# Patient Record
Sex: Female | Born: 1955 | Marital: Married | Smoking: Never smoker
Health system: Southern US, Community
[De-identification: ages and names within clinical notes are randomized; demographics above are authoritative.]

## PROBLEM LIST (undated history)

## (undated) DIAGNOSIS — M8580 Other specified disorders of bone density and structure, unspecified site: Secondary | ICD-10-CM

## (undated) HISTORY — PX: KNEE ARTHROSCOPY: SHX127

## (undated) HISTORY — DX: Other specified disorders of bone density and structure, unspecified site: M85.80

---

## 2005-10-30 ENCOUNTER — Ambulatory Visit: Payer: Self-pay | Admitting: Family Medicine

## 2007-07-09 ENCOUNTER — Ambulatory Visit: Payer: Self-pay | Admitting: Unknown Physician Specialty

## 2008-04-12 ENCOUNTER — Ambulatory Visit: Payer: Self-pay | Admitting: Internal Medicine

## 2013-07-20 ENCOUNTER — Ambulatory Visit: Payer: Self-pay

## 2013-08-23 DIAGNOSIS — S8991XA Unspecified injury of right lower leg, initial encounter: Secondary | ICD-10-CM | POA: Insufficient documentation

## 2013-10-04 DIAGNOSIS — S83249A Other tear of medial meniscus, current injury, unspecified knee, initial encounter: Secondary | ICD-10-CM | POA: Insufficient documentation

## 2013-12-28 IMAGING — CR DG KNEE COMPLETE 4+V*R*
1 series · 4 of 4 positions shown · non-contrast
Comparison: none

REASON FOR EXAM: pain
COMMENTS:

PROCEDURE:     KDR - KDXR KNEE RT COMP WITH OBLIQUES  - July 20, 2013 [DATE]
RESULT:     Comparison:  None

[Series 1: ap · 0.17mm/px · 4 of 4 slices shown]
[im 1/4]
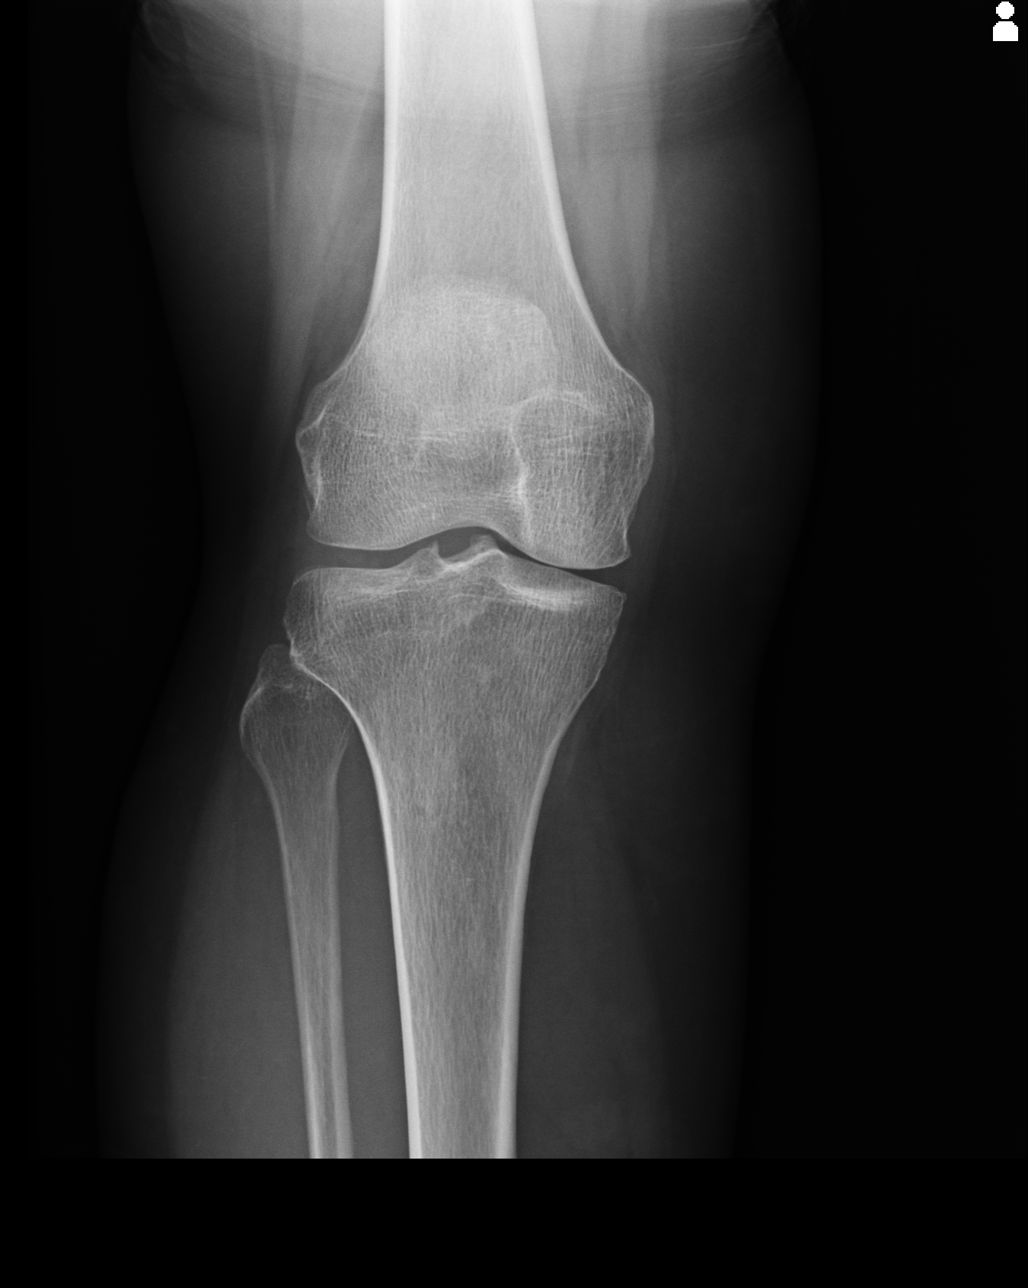
[im 2/4]
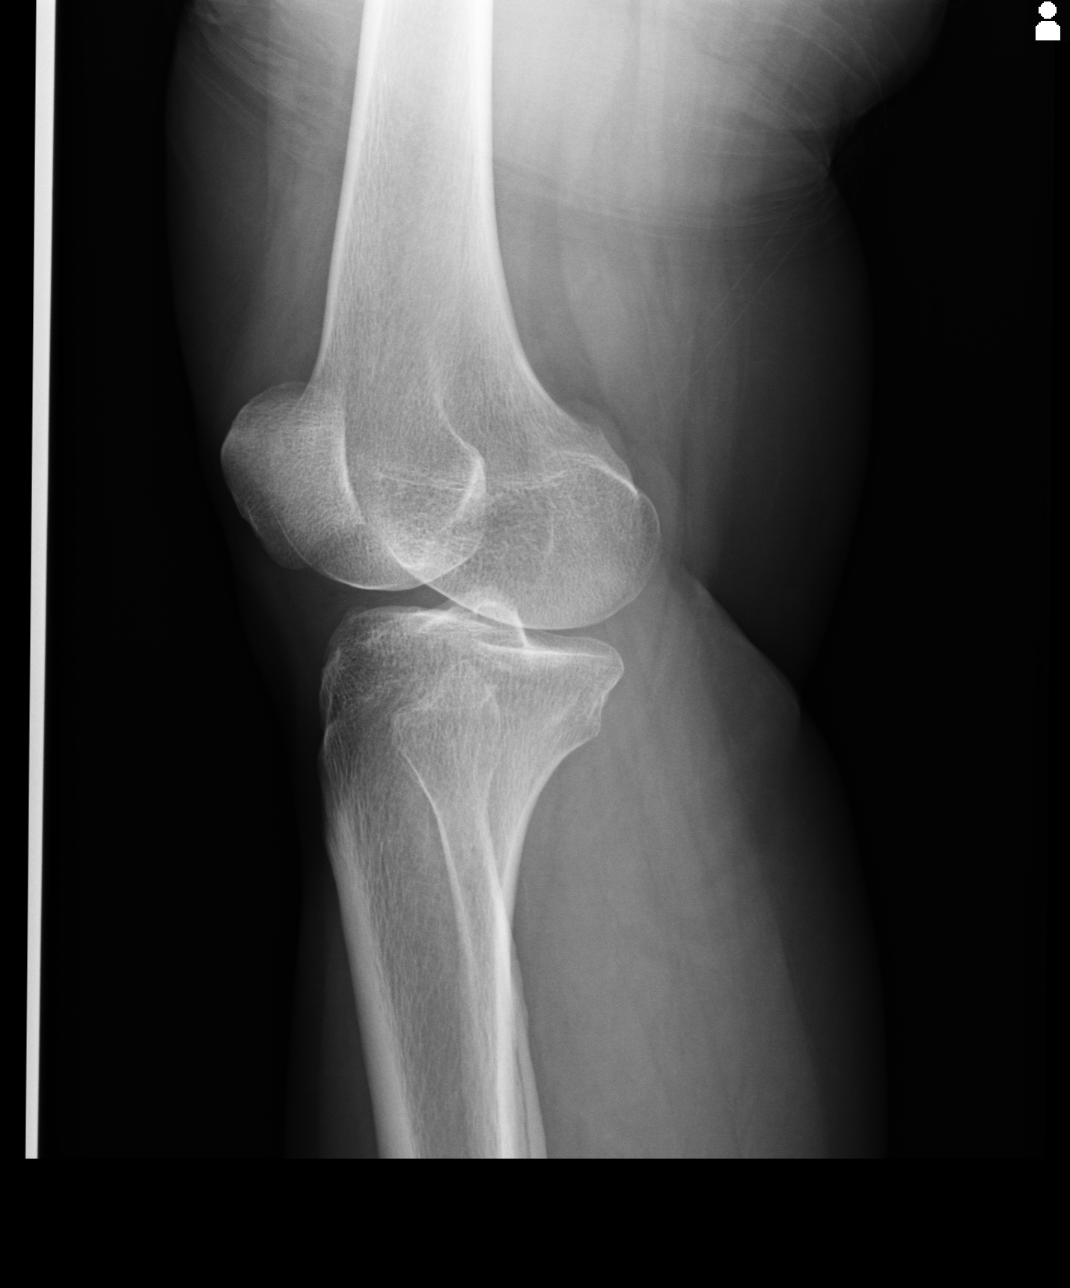
[im 3/4]
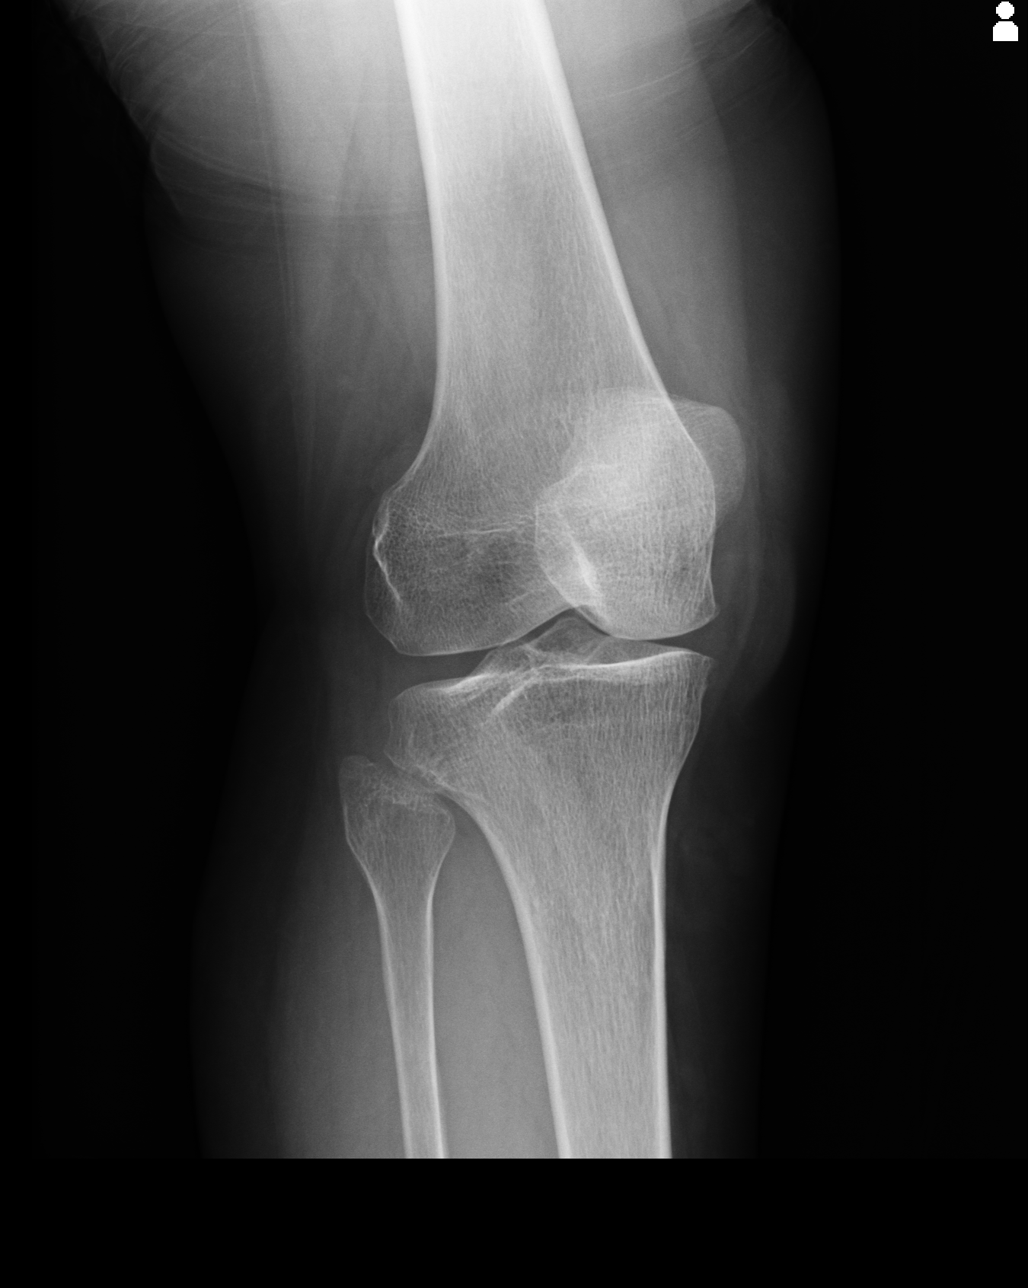
[im 4/4]
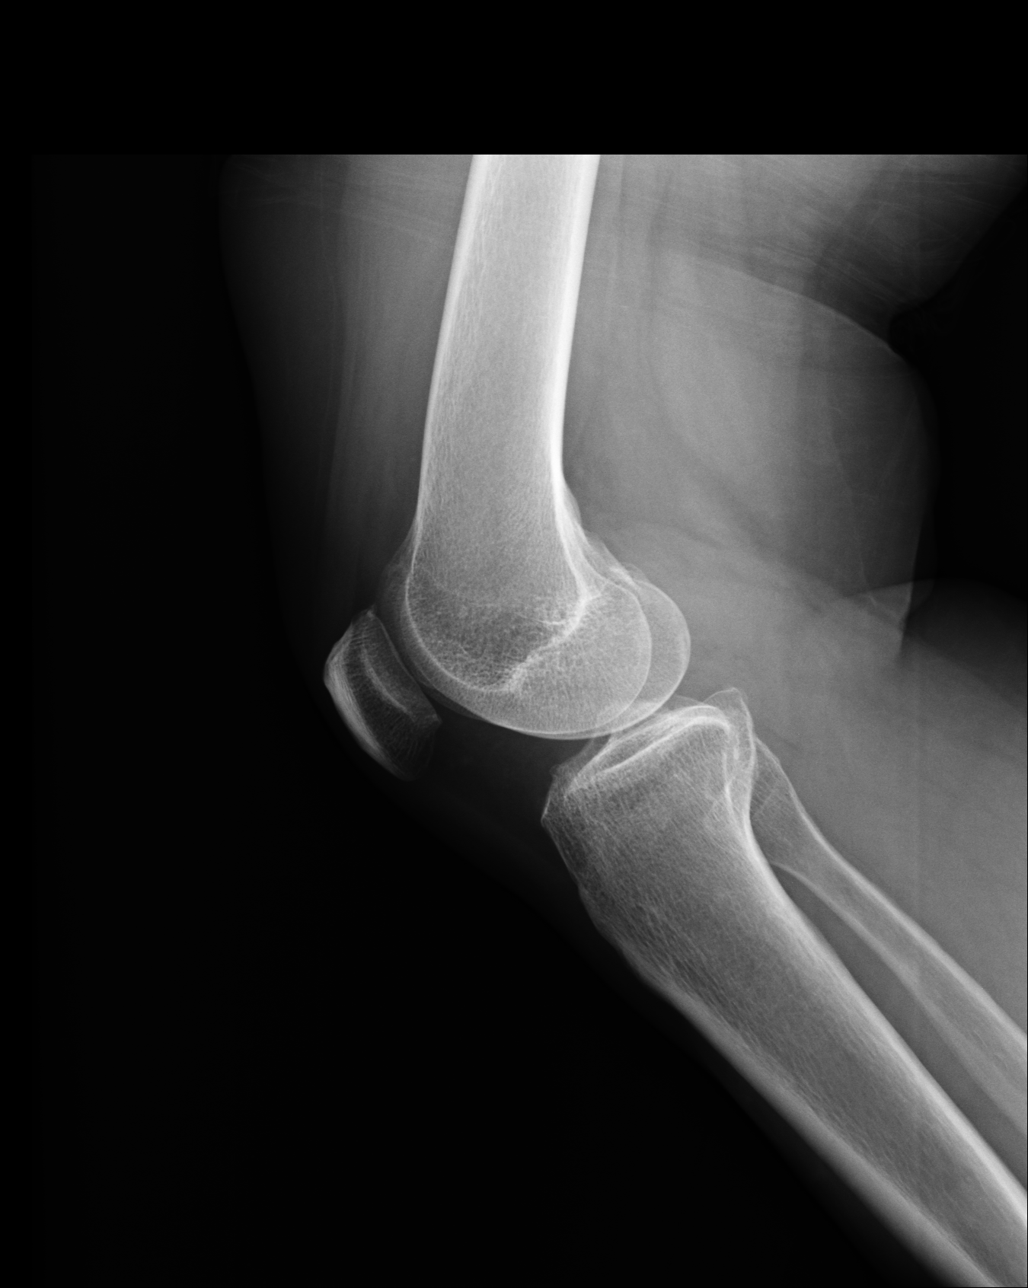

[4 of 4 positions shown; findings below may reference images not displayed]

FINDINGS: 4 views of the right knee demonstrates no acute fracture or dislocation.
There is no significant joint effusion.
IMPRESSION: No acute osseous injury of the right knee.

[REDACTED]

## 2017-06-05 ENCOUNTER — Other Ambulatory Visit: Payer: Self-pay | Admitting: Emergency Medicine

## 2017-06-05 ENCOUNTER — Ambulatory Visit
Admission: RE | Admit: 2017-06-05 | Discharge: 2017-06-05 | Disposition: A | Discharge status: Home / Self Care | Payer: Worker's Compensation | Source: Ambulatory Visit | Attending: Emergency Medicine | Admitting: Emergency Medicine

## 2017-06-05 ENCOUNTER — Other Ambulatory Visit: Payer: Self-pay | Admitting: Family

## 2017-06-05 ENCOUNTER — Other Ambulatory Visit: Payer: Self-pay | Admitting: *Deleted

## 2017-06-05 DIAGNOSIS — R52 Pain, unspecified: Secondary | ICD-10-CM

## 2017-06-05 DIAGNOSIS — M1711 Unilateral primary osteoarthritis, right knee: Secondary | ICD-10-CM | POA: Insufficient documentation

## 2017-06-05 DIAGNOSIS — T1490XA Injury, unspecified, initial encounter: Secondary | ICD-10-CM

## 2017-06-05 DIAGNOSIS — M2341 Loose body in knee, right knee: Secondary | ICD-10-CM | POA: Insufficient documentation

## 2017-06-05 DIAGNOSIS — M25561 Pain in right knee: Secondary | ICD-10-CM | POA: Diagnosis present

## 2017-08-31 DIAGNOSIS — M25561 Pain in right knee: Secondary | ICD-10-CM | POA: Insufficient documentation

## 2017-11-13 IMAGING — CR DG KNEE COMPLETE 4+V*R*
1 series · 5 of 5 positions shown · non-contrast
Comparison: Radiographs 07/20/2013.

CLINICAL DATA: Right knee pain and swelling after falling
yesterday. Initial encounter.

EXAM:
RIGHT KNEE - COMPLETE 4+ VIEW

[Series 1: dg knee complete 4 views right · 0.14mm/px · 5 of 5 slices shown]
[im 1/5]
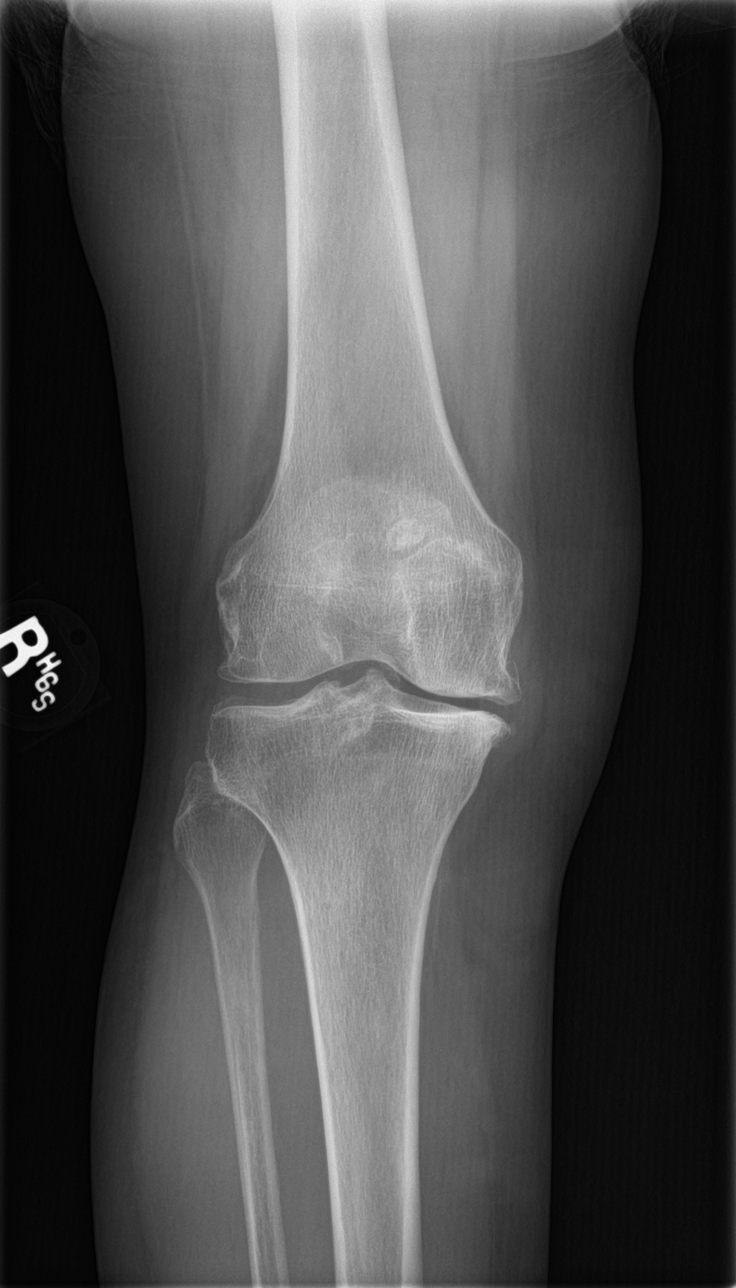
[im 2/5]
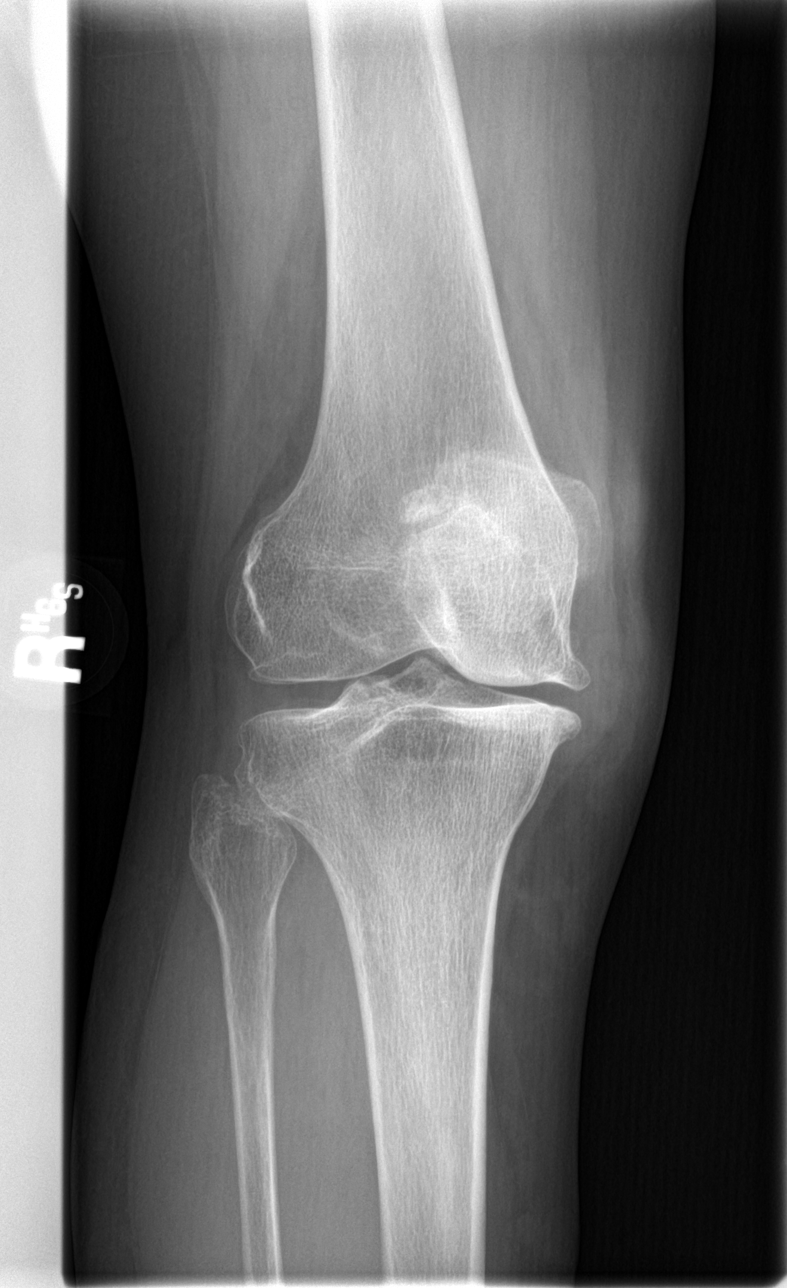
[im 3/5]
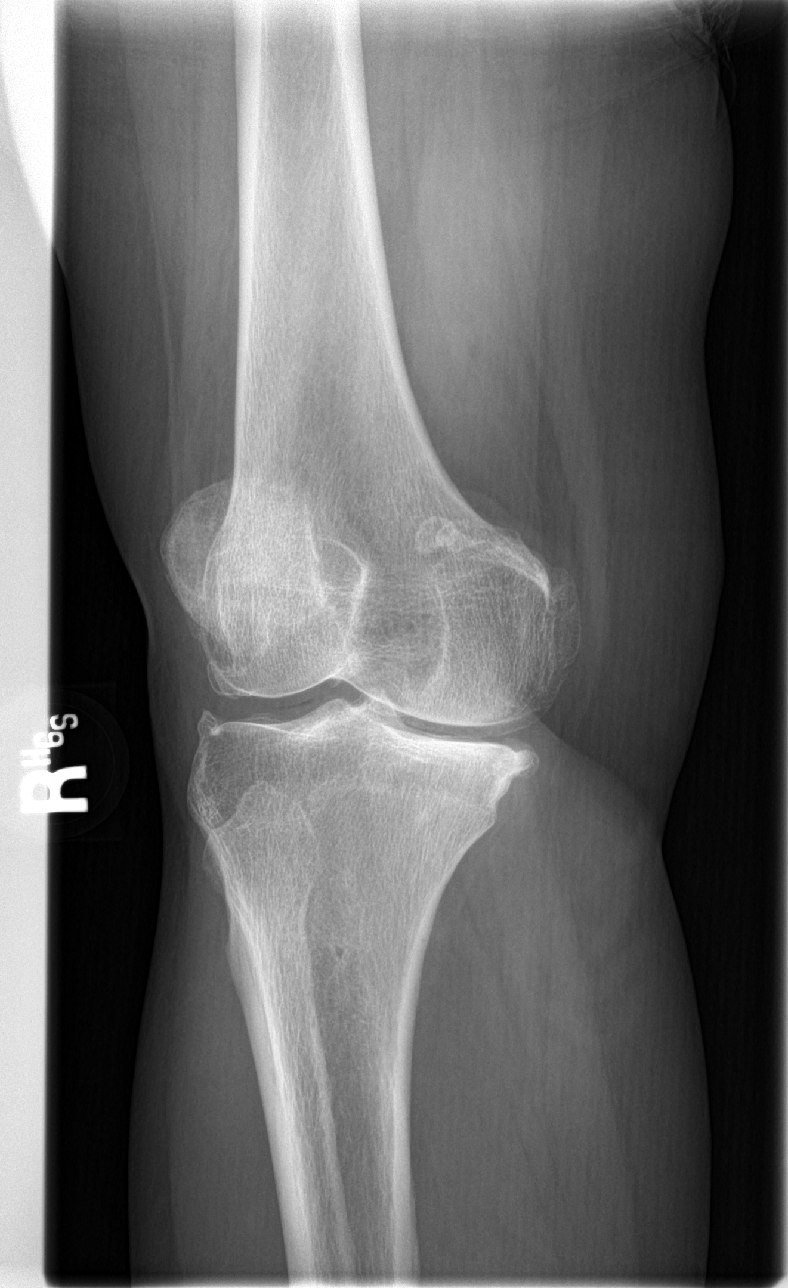
[im 4/5]
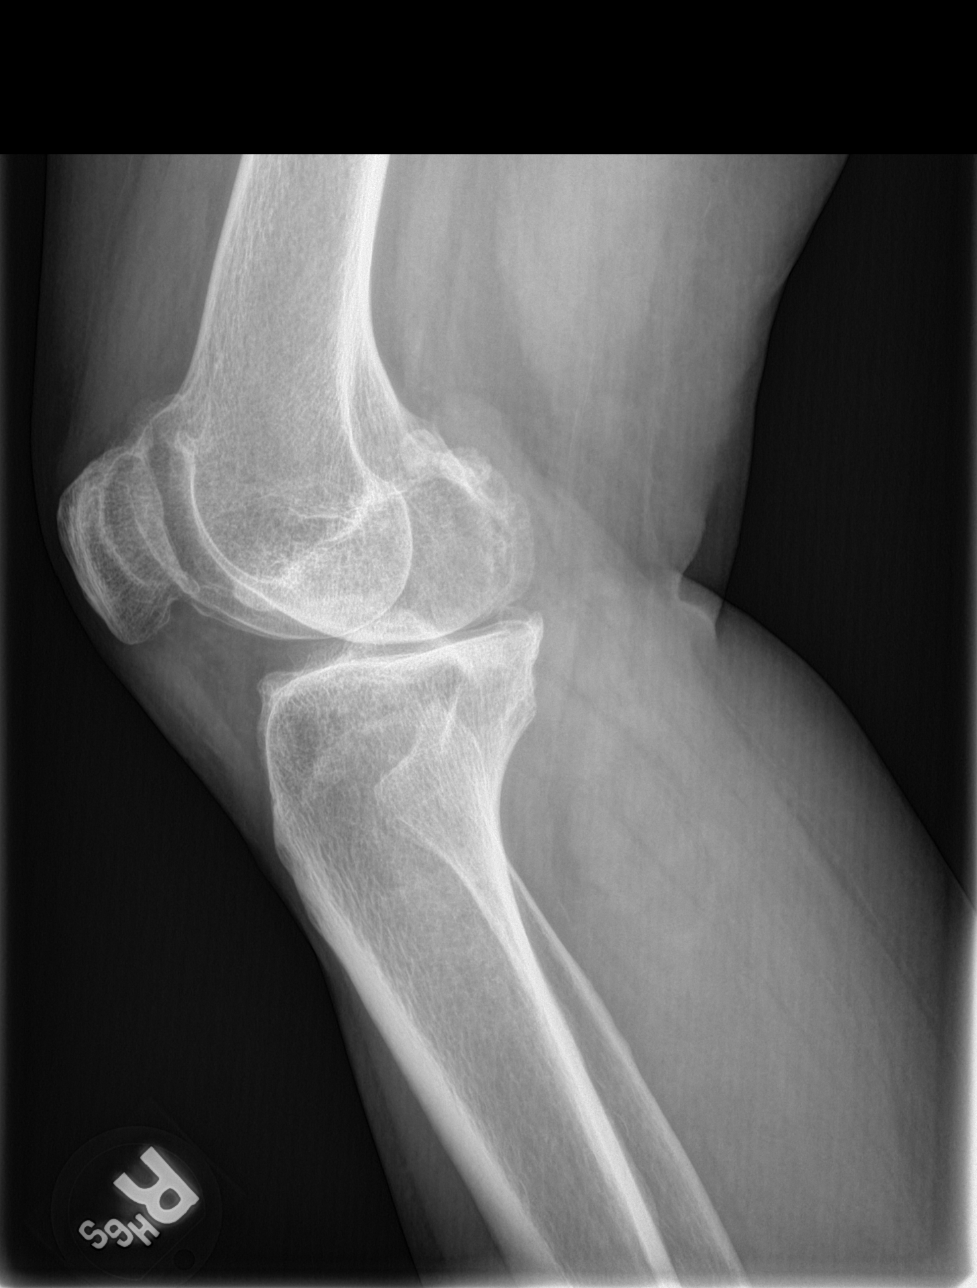
[im 5/5]
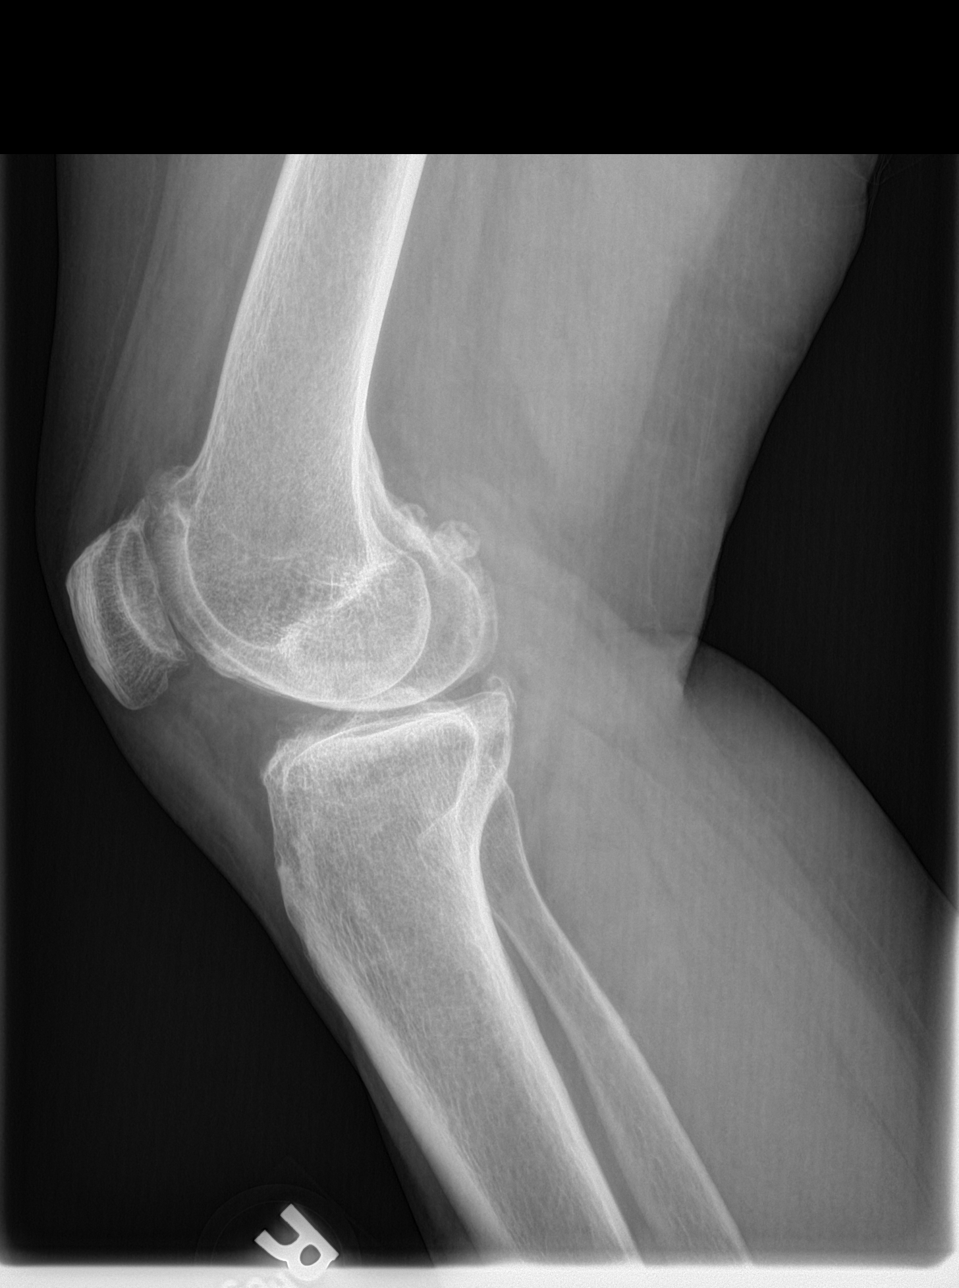

[5 of 5 positions shown; findings below may reference images not displayed]

FINDINGS: The mineralization and alignment are normal. There is no evidence of
acute fracture or dislocation. Progressive tricompartmental
degenerative changes since previous study, especially in the medial
compartment where there are prominent osteophytes. There is a
minimal joint effusion and edema in Hoffa's fat. Two loose bodies
are present posteriorly.
IMPRESSION: No acute osseous findings. Interval progression of tricompartmental
osteoarthritis with loose bodies.

## 2018-07-15 ENCOUNTER — Telehealth: Payer: Self-pay

## 2018-07-15 ENCOUNTER — Encounter: Payer: Self-pay | Admitting: Adult Health

## 2018-07-15 ENCOUNTER — Ambulatory Visit: Payer: Managed Care, Other (non HMO) | Admitting: Adult Health

## 2018-07-15 VITALS — BP 118/83 | HR 75 | Resp 16 | Ht <= 58 in | Wt 128.0 lb

## 2018-07-15 DIAGNOSIS — D519 Vitamin B12 deficiency anemia, unspecified: Secondary | ICD-10-CM | POA: Diagnosis not present

## 2018-07-15 DIAGNOSIS — Z7189 Other specified counseling: Secondary | ICD-10-CM

## 2018-07-15 DIAGNOSIS — Z1231 Encounter for screening mammogram for malignant neoplasm of breast: Secondary | ICD-10-CM

## 2018-07-15 DIAGNOSIS — Z1239 Encounter for other screening for malignant neoplasm of breast: Secondary | ICD-10-CM

## 2018-07-15 DIAGNOSIS — Z7185 Encounter for immunization safety counseling: Secondary | ICD-10-CM

## 2018-07-15 NOTE — Progress Notes (Signed)
[facility] [address] [place], [place] [id number]  Internal MEDICINE  Office Visit Note  Patient Name: Erica Phelps  [id number] 13, 2057  [id number]  Date of Service: 07/15/2018  Chief Complaint  Patient presents with  . Other    [place] going [place]  require yellow fever vaccine so they need note from primary care pt able to take a vaccine   . Quality Metric Gaps    mammogram    HPI  Pt here for well check.  She is requesting a letter that says she is healthy enough to take vaccines.  She denies recent sickness, fatigue or chronic illness.  She denies daily medications.  She [place] to [place] to [place].   Current Medication: No outpatient encounter medications on file as of 07/15/2018.   No facility-administered encounter medications on file as of 07/15/2018.     Surgical History: Past Surgical History:  Procedure Laterality Date  . KNEE ARTHROSCOPY      Medical History: Past Medical History:  Diagnosis Date  . Osteopenia     Family History: Family History  Problem Relation Age of Onset  . Heart disease Father     Social History   Socioeconomic History  . Marital status: Married    Spouse name: Not on file  . Number of children: Not on file  . Years of education: Not on file  . Highest education level: Not on file  Occupational History  . Not on file  Social Needs  . Financial resource strain: Not on file  . Food insecurity:    Worry: Not on file    Inability: Not on file  . Transportation needs:    Medical: Not on file    Non-medical: Not on file  Tobacco Use  . Smoking status: Never Smoker  . Smokeless tobacco: Never Used  Substance and Sexual Activity  . Alcohol use: Not on file  . Drug use: Not on file  . Sexual activity: Not on file  Lifestyle  . Physical activity:    Days per week: Not on file    Minutes per session: Not on file  . Stress: Not on file  Relationships  . Social connections:    Talks on phone:  Not on file    Gets together: Not on file    Attends religious service: Not on file    Active member of club or organization: Not on file    Attends meetings of clubs or organizations: Not on file    Relationship status: Not on file  . Intimate partner violence:    Fear of current or ex partner: Not on file    Emotionally abused: Not on file    Physically abused: Not on file    Forced sexual activity: Not on file  Other Topics Concern  . Not on file  Social History Narrative  . Not on file      Review of Systems  Constitutional: Negative for chills, fatigue and unexpected weight change.  HENT: Negative for congestion, rhinorrhea, sneezing and sore throat.   Eyes: Negative for photophobia, pain and redness.  Respiratory: Negative for cough, chest tightness and shortness of breath.   Cardiovascular: Negative for chest pain and palpitations.  Gastrointestinal: Negative for abdominal pain, constipation, diarrhea, nausea and vomiting.  Endocrine: Negative.   Genitourinary: Negative for dysuria and frequency.  Musculoskeletal: Negative for arthralgias, back pain, joint swelling and neck pain.  Skin: Negative for rash.  Allergic/Immunologic: Negative.   Neurological: Negative for tremors and numbness.  Hematological: Negative for adenopathy. Does not bruise/bleed easily.  Psychiatric/Behavioral: Negative for behavioral problems and sleep disturbance. The patient is not nervous/anxious.     Vital Signs: BP 118/83   Pulse 75   Resp 16   Ht 4\' 9"  (1.448 m)   Wt 128 lb (58.1 kg)   SpO2 98%   BMI 27.70 kg/m    Physical Exam  Constitutional: She is oriented to person, place, and time. She appears well-developed and well-nourished. No distress.  HENT:  Head: Normocephalic and atraumatic.  Mouth/Throat: Oropharynx is clear and moist. No oropharyngeal exudate.  Eyes: Pupils are equal, round, and reactive to light. EOM are normal.  Neck: Normal range of motion. Neck supple. No JVD  present. No tracheal deviation present. No thyromegaly present.  Cardiovascular: Normal rate, regular rhythm and normal heart sounds. Exam reveals no gallop and no friction rub.  No murmur heard. Pulmonary/Chest: Effort normal and breath sounds normal. No respiratory distress. She has no wheezes. She has no rales. She exhibits no tenderness.  Abdominal: Soft. There is no tenderness. There is no guarding.  Musculoskeletal: Normal range of motion.  Lymphadenopathy:    She has no cervical adenopathy.  Neurological: She is alert and oriented to person, place, and time. No cranial nerve deficit.  Skin: Skin is warm and dry. She is not diaphoretic.  Psychiatric: She has a normal mood and affect. Her behavior is normal. Judgment and thought content normal.  Nursing note and vitals reviewed.   Assessment/Plan: 1. Vaccine counseling Pt her for letter to certify safety, for vaccines.       General Counseling: [name] verbalizes understanding of the findings of todays visit and agrees with plan of treatment. I have discussed any further diagnostic evaluation that may be needed or ordered today. We also reviewed her medications today. she has been encouraged to call the office with any questions or concerns that should arise related to todays visit.    No orders of the defined types were placed in this encounter.   No orders of the defined types were placed in this encounter.   Time spent: 25 Minutes   This patient was seen by Blima Ledger AGNP-C in Collaboration with Dr Lyndon Code as a part of collaborative care agreement    Dr Lyndon Code Internal medicine

## 2018-07-15 NOTE — Patient Instructions (Signed)
Immunization Schedule, Adult Recommended immunizations These include:  Influenza vaccine. ? All adults should be immunized every year. ? All adults, including pregnant women and people with hives-only allergy to eggs can receive the inactivated influenza vaccine (IIV) or recombinant influenza vaccine (RIV). ? Adults aged 62-64 years can receive the IIV or RIV. The RIV vaccine does not contain any egg protein. ? Adults aged 79 years or older can receive the high-dose or adjuvanted IIV.  Tetanus, diphtheria, and acellular pertussis (Td, Tdap) vaccine. ? Pregnant women should receive 1 dose of Tdap vaccine during each pregnancy. The dose should be obtained regardless of the length of time since the last dose. Immunization is preferred during the 27th to 36th week of gestation. ? An adult who has not previously received Tdap or who does not know his or her vaccine status should receive 1 dose of Tdap. This initial dose should be followed by tetanus and diphtheria toxoids (Td) booster doses every 10 years. ? Adults with an unknown or incomplete history of completing a 3-dose immunization series with Td-containing vaccines should begin or complete a primary immunization series including a Tdap dose. The first 2 doses should be received at least 4 weeks apart, and the third dose 6-12 months after the second dose. ? Adults should receive a Td booster every 10 years.  Varicella vaccine. ? An adult without evidence of immunity to varicella should receive 2 doses 4-8 weeks apart, or a second dose if he or she has previously received 1 dose. ? Pregnant females who do not have evidence of immunity should receive the first dose after pregnancy. This first dose should be obtained before leaving the health care facility. The second dose should be obtained 4-8 weeks after the first dose. ? All healthcare workers should have evidence of immunity to varicella. ? Adults with cancer or those who are on therapy to  suppress the immune system should not receive the varicella vaccine.  Human papillomavirus (HPV) vaccine. ? Females aged 13-26 years who have not received the vaccine previously should obtain the 3-dose series. Females should receive the second dose 1-2 months after the first dose, and the third dose 6 months after the first dose. ? The vaccine is not recommended for use in pregnant females. However, pregnancy testing is not needed before receiving a dose. If a female is found to be pregnant after receiving a dose, no treatment is needed. In that case, the remaining doses should be delayed until after the pregnancy. ? Males aged 78-21 years who have not received the vaccine previously should receive the 3-dose series. Males aged 22-26 years may also receive a 3 dose series. Males should receive the second dose 1-2 months after the first dose, and the third dose 6 months after the first dose. ? Adult females up to age 97 years and adult males up to age 88 years who initiated the HPV vaccine series before age 69 years and received 2 doses at least 5 months apart do not need an additional dose of the vaccine. ? Adult females up to age 42 years and adult female up to age 54 years who initiated the HPV vaccine series before 15 years but only received 1 dose or 2 doses less than 5 months apart should receive 1 additional dose of the vaccine. ? Immunization is recommended through the age of 69 years for any female who has sex with males and did not get any or all doses earlier. ? Immunization is recommended for  any person with an immunocompromised condition through the age of 78 years if he or she did not get any or all doses earlier.  Zoster vaccine. ? One dose is recommended for adults aged 63 years or older unless certain conditions are present.  Measles, mumps, and rubella (MMR) vaccine. ? Adults born before [birth year] generally are considered immune to measles and mumps. ? Adults born in [birth year] or later should  have 1 or more doses of MMR vaccine unless there is a contraindication to the vaccine or there is laboratory evidence of immunity to each of the three diseases. ? A routine second dose of MMR vaccine should be obtained at least 28 days after the first dose for students attending postsecondary schools, health care workers, or international travelers. ? People who received inactivated measles vaccine or an unknown type of measles vaccine during 1963-1967 should be revaccinated with 1 or 2 doses of MMR vaccine. ? People who received inactivated mumps vaccine or an unknown type of mumps vaccine before 1979 and are at high risk for mumps infection should consider immunization with 2 doses of MMR vaccine. ? For females of childbearing age, rubella immunity should be determined. If there is no evidence of immunity, females who are not pregnant should receive 1 dose of MMR. If there is no evidence of immunity, females who are pregnant should receive 1 dose of MMR after pregnancy and before leaving the healthcare facility. ? Unvaccinated health care workers born before [birth year] who lack laboratory evidence of measles, mumps, or rubella immunity or laboratory confirmation of disease should consider 2 doses of MMR 18 days apart for measles or mumps, or 1 dose of MMR for rubella.  Pneumococcal vaccines. ? All adults aged 102 years and older should receive 13-valent pneumococcal conjugate vaccine (PCV13) followed by 23-valent pneumococcal polyscaccharide vaccine (PPSV23) at least 1 year after PCV13. ? An adult aged 94 years or older who has certain medical conditions and has not been previously immunized should receive 1 dose of PCV13 vaccine. This PCV13 should be followed with a dose of pneumococcal polysaccharide (PPSV23) vaccine. The PPSV23 vaccine dose should be obtained at least 8 weeks after the dose of PCV13 vaccine. ? An adult aged 72 years or older who has certain medical conditions and previously received 1 or  more doses of PPSV23 vaccine should receive 1 dose of PCV13. The PCV13 vaccine dose should be obtained 1 or more years after the last PPSV23 vaccine dose. ? PPSV23 vaccination should happen in all adults aged 19-64 who smoke cigarettes. ? People with an immunocompromised condition and certain other conditions should receive both PCV13 and PPSV23 vaccines. ? When indicated, people who have unknown immunization and have no record of immunization should receive PPSV23 vaccine. ? People who received 1-2 doses of PPSV23 before age 64 years should receive another dose of PPSV23 vaccine at age 83 years or later if at least 5 years have passed since the previous dose. ? Doses of PPSV23 are not needed for people immunized with PPSV23 at or after age 52 years.  Meningococcal vaccine. ? Adults with asplenia or persistent complement component deficiencies should receive 2 doses of quadrivalent meningococcal conjugate (MenACWY-D) vaccine. The doses should be obtained at least 2 months apart. Revaccination should occur every 5 years. A 2-dose or 3-dose series of serogroup B meningococcal (MenB)vaccine should also be obtained. ? Microbiologists working with certain meningococcal bacteria, [employer] recruits, people at risk during an outbreak, and people who travel to or live in  2 months apart. Revaccination should occur every 5 years. A 2-dose or 3-dose series of serogroup B meningococcal (MenB)vaccine should also be obtained.  ? Microbiologists working with certain meningococcal bacteria, military recruits, people at risk during an outbreak, and people who travel to or live in countries with a high rate of meningitis should be immunized. Revaccination is recommended every 5 years if the risk for infection remains.  ? Adults with HIV infection who have not been previously vaccinated should receive a 2-dose MenACWY series with doses at least 2 months apart. If 1 dose was received previously, a second dose should be obtained at least 2 months later. Revaccination is recommended every 5 years.  ? A first-year college student up through age 21 years who is living in a residence hall should receive a dose if he or she did not receive a dose on or after his or her 16th birthday.  ? Adults aged 16-23 years may receive 2 doses of  MenB vaccine for short-term protection against most strains of serogroup B meningococcal disease.  · Hepatitis A vaccine.  ? Adults who wish to be protected from this disease, have certain high-risk conditions, work with hepatitis A-infected animals, work in hepatitis A research labs, or travel to or work in countries with a high rate of hepatitis A should be immunized.  ? Adults who were previously unvaccinated and who anticipate close contact with an international adoptee during the first 60 days after arrival in the [place] from a country with a high rate of hepatitis A should be immunized.  ? The vaccine may be given as a 2 or 3-dose series by itself or in combination with the hepatitis B vaccine (HepB).  · Hepatitis B vaccine.  ? Adults who wish to be protected from this disease, may be exposed to blood or other infectious body fluids, are household contacts or sex partners of hepatitis B positive people, are clients or workers in certain care facilities, or travel to or work in countries with a high rate of hepatitis B should be immunized.  ? Adults with chronic liver disease, such as hepatitis C infection, cirrhosis, fatty liver disease, alcoholic liver disease, autoimmune hepatitis, and elevated liver chemistry levels should be vaccinated.  ? Pregnant women who are at risk for hepatitis B virus infection during pregnancy should be immunized.  ? The vaccine may be given as a 3-dose series by itself or in combination with the hepatitis A vaccine (HepA).  · Haemophilus influenzae type b (Hib) vaccine.  ? A previously unvaccinated person with asplenia or sickle cell disease or having a scheduled splenectomy should receive 1 dose of Hib vaccine. This should happen at least 14 days before the procedure.  ? Regardless of previous immunization, a recipient of a hematopoietic stem cell transplant should receive a 3-dose series, with at least 4 weeks between doses, 6-12 months after his or her successful  transplant.  ? Hib vaccine is not recommended for adults with HIV infection.    This information is not intended to replace advice given to you by your health care provider. Make sure you discuss any questions you have with your health care provider.  Document Released: 02/21/2004 Document Revised: 08/13/2016 Document Reviewed: 08/13/2016  Elsevier Interactive Patient Education © 2018 [employer]

## 2018-07-15 NOTE — Telephone Encounter (Signed)
Patient scheduled appointment. Erica Phelps

## 2018-07-16 ENCOUNTER — Telehealth: Payer: Self-pay

## 2018-07-16 NOTE — Telephone Encounter (Signed)
lmom pt need tp repeat cologuard and she need to call cologuard [phone]

## 2018-07-19 ENCOUNTER — Encounter: Payer: Self-pay | Admitting: Adult Health

## 2018-07-19 NOTE — Progress Notes (Signed)
SCANNED IN LAB RESULTS- [facility] SCREENINGS.

## 2018-07-20 ENCOUNTER — Encounter: Payer: Self-pay | Admitting: Adult Health

## 2018-07-22 ENCOUNTER — Telehealth: Payer: Self-pay

## 2018-07-22 NOTE — Telephone Encounter (Signed)
Gave pt husband cologuard information to redo cologuard test

## 2018-07-23 ENCOUNTER — Telehealth: Payer: Self-pay

## 2018-07-23 NOTE — Telephone Encounter (Signed)
Called patient and spoke with her husband letting them know that [facility] her [facility]

## 2018-10-29 ENCOUNTER — Encounter: Payer: Self-pay | Admitting: Nurse Practitioner

## 2018-11-25 ENCOUNTER — Other Ambulatory Visit: Payer: Self-pay | Admitting: Internal Medicine

## 2018-12-10 ENCOUNTER — Encounter: Payer: Self-pay | Admitting: Nurse Practitioner

## 2018-12-20 ENCOUNTER — Encounter: Payer: Self-pay | Admitting: Nurse Practitioner

## 2018-12-20 ENCOUNTER — Ambulatory Visit (INDEPENDENT_AMBULATORY_CARE_PROVIDER_SITE_OTHER): Payer: Managed Care, Other (non HMO) | Admitting: Nurse Practitioner

## 2018-12-20 VITALS — BP 132/88 | HR 59 | Resp 16 | Ht <= 58 in | Wt 128.0 lb

## 2018-12-20 DIAGNOSIS — Z0001 Encounter for general adult medical examination with abnormal findings: Secondary | ICD-10-CM | POA: Diagnosis not present

## 2018-12-20 DIAGNOSIS — S6000XA Contusion of unspecified finger without damage to nail, initial encounter: Secondary | ICD-10-CM | POA: Insufficient documentation

## 2018-12-20 DIAGNOSIS — E559 Vitamin D deficiency, unspecified: Secondary | ICD-10-CM

## 2018-12-20 DIAGNOSIS — R300 Dysuria: Secondary | ICD-10-CM

## 2018-12-20 DIAGNOSIS — Z124 Encounter for screening for malignant neoplasm of cervix: Secondary | ICD-10-CM

## 2018-12-20 DIAGNOSIS — D508 Other iron deficiency anemias: Secondary | ICD-10-CM | POA: Diagnosis not present

## 2018-12-20 NOTE — Progress Notes (Signed)
[facility] [address] [place], [place] [address]  Internal MEDICINE  Office Visit Note  Patient Name: Erica Phelps  [id number]  [id number]  Date of Service: 12/20/2018   Pt is here for routine health maintenance examination  Chief Complaint  Patient presents with  . Annual Exam  . Quality Metric Gaps    mammogram done last week, colonoscopy due,  . Gynecologic Exam     The patient is here for routine health maintenance exam. Today, blood pressure is slightly elevated. Not usual for her. Due to have routine, fasting labs done. Had mammogram last week. Awaiting results. No concerns or complaints today.     Current Medication: No outpatient encounter medications on file as of 12/20/2018.   No facility-administered encounter medications on file as of 12/20/2018.     Surgical History: Past Surgical History:  Procedure Laterality Date  . KNEE ARTHROSCOPY      Medical History: Past Medical History:  Diagnosis Date  . Osteopenia     Family History: Family History  Problem Relation Age of Onset  . Heart disease Father       Review of Systems  Constitutional: Negative for chills, fatigue and unexpected weight change.  HENT: Negative for congestion, postnasal drip, rhinorrhea, sneezing and sore throat.   Respiratory: Negative for cough, chest tightness, shortness of breath and wheezing.   Cardiovascular: Negative for chest pain and palpitations.  Gastrointestinal: Negative for abdominal pain, constipation, diarrhea, nausea and vomiting.  Endocrine: Negative for cold intolerance, polydipsia and polyuria.  Genitourinary: Negative.  Negative for dysuria and frequency.  Musculoskeletal: Negative for arthralgias, back pain, joint swelling and neck pain.  Skin: Negative for rash.  Allergic/Immunologic: Negative for environmental allergies.  Neurological: Negative for dizziness, tremors, numbness and headaches.  Hematological: Negative for adenopathy. Does  not bruise/bleed easily.  Psychiatric/Behavioral: Negative for behavioral problems (Depression), sleep disturbance and suicidal ideas. The patient is not nervous/anxious.      Today's Vitals   12/20/18 1042  BP: 132/88  Pulse: (!) 59  Resp: 16  SpO2: 100%  Weight: 128 lb (58.1 kg)  Height: 4\' 9"  (1.448 m)  Body mass index is 27.7 kg/m.  Physical Exam Vitals signs and nursing note reviewed.  Constitutional:      General: She is not in acute distress.    Appearance: Normal appearance. She is well-developed. She is not diaphoretic.  HENT:     Head: Normocephalic and atraumatic.     Mouth/Throat:     Pharynx: No oropharyngeal exudate.  Eyes:     Extraocular Movements: Extraocular movements intact.     Conjunctiva/sclera: Conjunctivae normal.     Pupils: Pupils are equal, round, and reactive to light.  Neck:     Musculoskeletal: Normal range of motion and neck supple.     Thyroid: No thyromegaly.     Vascular: No carotid bruit or JVD.     Trachea: No tracheal deviation.  Cardiovascular:     Rate and Rhythm: Normal rate and regular rhythm.     Pulses: Normal pulses.     Heart sounds: Normal heart sounds. No murmur. No friction rub. No gallop.   Pulmonary:     Effort: Pulmonary effort is normal. No respiratory distress.     Breath sounds: Normal breath sounds. No wheezing or rales.  Chest:     Chest wall: No tenderness.  Abdominal:     General: Bowel sounds are normal.     Palpations: Abdomen is soft.  Tenderness: There is no abdominal tenderness.  Genitourinary:    General: Normal vulva.     Comments: No tenderness, masses, or organomeglay present during bimanual exam . Musculoskeletal: Normal range of motion.  Lymphadenopathy:     Cervical: No cervical adenopathy.  Skin:    General: Skin is warm and dry.     Capillary Refill: Capillary refill takes less than 2 seconds.  Neurological:     General: No focal deficit present.     Mental Status: She is alert and  oriented to person, place, and time.     Cranial Nerves: No cranial nerve deficit.  Psychiatric:        Behavior: Behavior normal.        Thought Content: Thought content normal.        Judgment: Judgment normal.   Assessment/Plan:  1. Encounter for general adult medical examination with abnormal findings Annual health maintenance exam with pap smear today.   2. Iron deficiency anemia secondary to inadequate dietary iron intake Check labs with iron panel and treat as indicated   3. Vitamin D deficiency Check vitamin d and treat for deficiency if abnormal levels.   4. Screening for malignant neoplasm of cervix - Pap IG and HPV (high risk) DNA detection  5. Dysuria - UA/M w/rflx Culture, Routine   General Counseling: [name] verbalizes understanding of the findings of todays visit and agrees with plan of treatment. I have discussed any further diagnostic evaluation that may be needed or ordered today. We also reviewed her medications today. she has been encouraged to call the office with any questions or concerns that should arise related to todays visit.    Counseling:  This patient was seen by Vincent Gros FNP Collaboration with Dr Lyndon Code as a part of collaborative care agreement  Orders Placed This Encounter  Procedures  . UA/M w/rflx Culture, Routine  . CBC with Differential/Platelet  . Comprehensive metabolic panel  . Lipid panel  . TSH  . T4, free  . Vitamin D 1,25 dihydroxy  . Iron, TIBC and Ferritin Panel  . Vitamin B12  . Folate      Time spent: 26 Minutes      Lyndon Code, MD  Internal Medicine

## 2018-12-21 LAB — UA/M W/RFLX CULTURE, ROUTINE
Bilirubin, UA: NEGATIVE
GLUCOSE, UA: NEGATIVE
Ketones, UA: NEGATIVE
Leukocytes, UA: NEGATIVE
Nitrite, UA: NEGATIVE
Protein, UA: NEGATIVE
RBC, UA: NEGATIVE
SPEC GRAV UA: 1.008 (ref 1.005–1.030)
Urobilinogen, Ur: 0.2 mg/dL (ref 0.2–1.0)
pH, UA: 5.5 (ref 5.0–7.5)

## 2018-12-21 LAB — MICROSCOPIC EXAMINATION
Casts: NONE SEEN /lpf
Epithelial Cells (non renal): NONE SEEN /hpf (ref 0–10)
RBC, UA: NONE SEEN /hpf (ref 0–2)

## 2018-12-22 ENCOUNTER — Other Ambulatory Visit: Payer: Self-pay | Admitting: Nurse Practitioner

## 2018-12-23 LAB — COMPREHENSIVE METABOLIC PANEL
ALT: 12 IU/L (ref 0–32)
AST: 18 IU/L (ref 0–40)
Albumin/Globulin Ratio: 1.8 (ref 1.2–2.2)
Albumin: 4.3 g/dL (ref 3.6–4.8)
Alkaline Phosphatase: 68 IU/L (ref 39–117)
BUN/Creatinine Ratio: 14 (ref 12–28)
BUN: 11 mg/dL (ref 8–27)
Bilirubin Total: 0.6 mg/dL (ref 0.0–1.2)
CO2: 21 mmol/L (ref 20–29)
Calcium: 9.3 mg/dL (ref 8.7–10.3)
Chloride: 104 mmol/L (ref 96–106)
Creatinine, Ser: 0.78 mg/dL (ref 0.57–1.00)
GFR calc non Af Amer: 82 mL/min/{1.73_m2} (ref >59)
GFR, EST AFRICAN AMERICAN: 94 mL/min/{1.73_m2} (ref >59)
Globulin, Total: 2.4 g/dL (ref 1.5–4.5)
Glucose: 84 mg/dL (ref 65–99)
Potassium: 4 mmol/L (ref 3.5–5.2)
Sodium: 138 mmol/L (ref 134–144)
Total Protein: 6.7 g/dL (ref 6.0–8.5)

## 2018-12-23 LAB — CBC
HEMOGLOBIN: 12.5 g/dL (ref 11.1–15.9)
Hematocrit: 36.9 % (ref 34.0–46.6)
MCH: 30.6 pg (ref 26.6–33.0)
MCHC: 33.9 g/dL (ref 31.5–35.7)
MCV: 90 fL (ref 79–97)
Platelets: 334 10*3/uL (ref 150–450)
RBC: 4.08 x10E6/uL (ref 3.77–5.28)
RDW: 12.7 % (ref 11.7–15.4)
WBC: 5.8 10*3/uL (ref 3.4–10.8)

## 2018-12-23 LAB — T3: T3, Total: 101 ng/dL (ref 71–180)

## 2018-12-23 LAB — PAP IG AND HPV HIGH-RISK: HPV, high-risk: NEGATIVE

## 2018-12-23 LAB — VITAMIN D 25 HYDROXY (VIT D DEFICIENCY, FRACTURES): Vit D, 25-Hydroxy: 13.9 ng/mL — ABNORMAL LOW (ref 30.0–100.0)

## 2018-12-23 LAB — LIPID PANEL W/O CHOL/HDL RATIO
Cholesterol, Total: 207 mg/dL — ABNORMAL HIGH (ref 100–199)
HDL: 70 mg/dL (ref >39)
LDL Calculated: 114 mg/dL — ABNORMAL HIGH (ref 0–99)
Triglycerides: 113 mg/dL (ref 0–149)
VLDL Cholesterol Cal: 23 mg/dL (ref 5–40)

## 2018-12-23 LAB — IRON AND TIBC
Iron Saturation: 33 % (ref 15–55)
Iron: 99 ug/dL (ref 27–139)
Total Iron Binding Capacity: 298 ug/dL (ref 250–450)
UIBC: 199 ug/dL (ref 118–369)

## 2018-12-23 LAB — FERRITIN: Ferritin: 68 ng/mL (ref 15–150)

## 2018-12-23 LAB — TSH: TSH: 1.2 u[IU]/mL (ref 0.450–4.500)

## 2018-12-23 LAB — B12 AND FOLATE PANEL
Folate: 9.3 ng/mL (ref >3.0)
Vitamin B-12: 281 pg/mL (ref 232–1245)

## 2018-12-23 LAB — T4, FREE: Free T4: 1.33 ng/dL (ref 0.82–1.77)

## 2019-01-04 ENCOUNTER — Telehealth: Payer: Self-pay

## 2019-01-04 NOTE — Telephone Encounter (Signed)
Mailed pap results letter to patient after several attempt to reaching her via phone, also informed her per Herbert Seta that she suggest that we schedule a cologuard test, she should expect a call within the next few weeks

## 2019-05-02 ENCOUNTER — Other Ambulatory Visit: Payer: Self-pay

## 2019-05-02 MED ORDER — ERGOCALCIFEROL 1.25 MG (50000 UT) PO CAPS: 50000.0000 [IU] | ORAL_CAPSULE | ORAL | 5 refills | Status: DC

## 2019-06-20 ENCOUNTER — Other Ambulatory Visit: Payer: Self-pay | Admitting: Internal Medicine

## 2019-06-20 MED ORDER — ERGOCALCIFEROL 1.25 MG (50000 UT) PO CAPS: 50000.0000 [IU] | ORAL_CAPSULE | ORAL | 3 refills | Status: DC

## 2019-09-21 ENCOUNTER — Other Ambulatory Visit: Payer: Self-pay | Admitting: Internal Medicine

## 2019-11-29 ENCOUNTER — Other Ambulatory Visit: Payer: Self-pay

## 2019-11-29 MED ORDER — VITAMIN D (ERGOCALCIFEROL) 1.25 MG (50000 UNIT) PO CAPS: 50000.0000 [IU] | ORAL_CAPSULE | ORAL | 3 refills | Status: DC

## 2019-11-30 ENCOUNTER — Telehealth: Payer: Self-pay

## 2019-11-30 NOTE — Telephone Encounter (Signed)
CONFIRMED AND SCREENED FOR 12-02-19 OV. °

## 2019-12-02 ENCOUNTER — Encounter: Payer: Self-pay | Admitting: Nurse Practitioner

## 2019-12-02 ENCOUNTER — Encounter (INDEPENDENT_AMBULATORY_CARE_PROVIDER_SITE_OTHER): Payer: Self-pay

## 2019-12-02 ENCOUNTER — Ambulatory Visit: Payer: Managed Care, Other (non HMO) | Admitting: Nurse Practitioner

## 2019-12-02 ENCOUNTER — Other Ambulatory Visit: Payer: Self-pay

## 2019-12-02 VITALS — BP 112/82 | HR 70 | Temp 97.3°F | Resp 16 | Ht <= 58 in | Wt 118.0 lb

## 2019-12-02 DIAGNOSIS — E559 Vitamin D deficiency, unspecified: Secondary | ICD-10-CM

## 2019-12-02 DIAGNOSIS — D519 Vitamin B12 deficiency anemia, unspecified: Secondary | ICD-10-CM | POA: Diagnosis not present

## 2019-12-02 MED ORDER — VITAMIN D (ERGOCALCIFEROL) 1.25 MG (50000 UNIT) PO CAPS: 50000.0000 [IU] | ORAL_CAPSULE | ORAL | 0 refills | Status: DC

## 2019-12-02 NOTE — Progress Notes (Signed)
[facility] [address] [place], [place] [address]  Internal MEDICINE  Office Visit Note  Patient Name: Erica Phelps  [id number]  [id number]  Date of Service: 12/02/2019  Chief Complaint  Patient presents with  . Medical Management of Chronic Issues     discuss refill for vitamin d     The patient is here for routine follow up visit. She needs to have refill of her vitamin d level. She has history of vitamin d deficiency. She should have labs checked prior to her appointment for CPE ion 12/22/2019. She states she has no problems or concerns to discuss.       Current Medication: Outpatient Encounter Medications as of 12/02/2019  Medication Sig  . Vitamin D, Ergocalciferol, (DRISDOL) 1.25 MG (50000 UT) CAPS capsule Take 1 capsule (50,000 Units total) by mouth once a week.  . [DISCONTINUED] Vitamin D, Ergocalciferol, (DRISDOL) 1.25 MG (50000 UT) CAPS capsule Take 1 capsule (50,000 Units total) by mouth once a week.   No facility-administered encounter medications on file as of 12/02/2019.    Surgical History: Past Surgical History:  Procedure Laterality Date  . KNEE ARTHROSCOPY      Medical History: Past Medical History:  Diagnosis Date  . Osteopenia     Family History: Family History  Problem Relation Age of Onset  . Heart disease Father     Social History   Socioeconomic History  . Marital status: Married    Spouse name: Not on file  . Number of children: Not on file  . Years of education: Not on file  . Highest education level: Not on file  Occupational History  . Not on file  Tobacco Use  . Smoking status: Never Smoker  . Smokeless tobacco: Never Used  Substance and Sexual Activity  . Alcohol use: Never  . Drug use: Never  . Sexual activity: Not on file  Other Topics Concern  . Not on file  Social History Narrative  . Not on file   Social Determinants of Health   Financial Resource Strain:   . Difficulty of Paying Living  Expenses: Not on file  Food Insecurity:   . Worried About Programme researcher, broadcasting/film/videounning Out of Food in the Last Year: Not on file  . Ran Out of Food in the Last Year: Not on file  Transportation Needs:   . Lack of Transportation (Medical): Not on file  . Lack of Transportation (Non-Medical): Not on file  Physical Activity:   . Days of Exercise per Week: Not on file  . Minutes of Exercise per Session: Not on file  Stress:   . Feeling of Stress : Not on file  Social Connections:   . Frequency of Communication with Friends and Family: Not on file  . Frequency of Social Gatherings with Friends and Family: Not on file  . Attends Religious Services: Not on file  . Active Member of Clubs or Organizations: Not on file  . Attends BankerClub or Organization Meetings: Not on file  . Marital Status: Not on file  Intimate Partner Violence:   . Fear of Current or Ex-Partner: Not on file  . Emotionally Abused: Not on file  . Physically Abused: Not on file  . Sexually Abused: Not on file      Review of Systems  Constitutional: Negative for chills, fatigue and unexpected weight change.  HENT: Negative for congestion, postnasal drip, rhinorrhea, sneezing and sore throat.   Respiratory: Negative for cough, chest tightness, shortness of breath and  wheezing.   Cardiovascular: Negative for chest pain and palpitations.  Gastrointestinal: Negative for abdominal pain, constipation, diarrhea, nausea and vomiting.  Endocrine: Negative for cold intolerance, heat intolerance, polydipsia and polyuria.  Musculoskeletal: Negative for arthralgias, back pain, joint swelling and neck pain.  Skin: Negative for rash.  Allergic/Immunologic: Negative for environmental allergies.  Neurological: Negative for dizziness, tremors, numbness and headaches.  Hematological: Negative for adenopathy. Does not bruise/bleed easily.  Psychiatric/Behavioral: Negative for behavioral problems (Depression), sleep disturbance and suicidal ideas. The patient is  not nervous/anxious.     Today's Vitals   12/02/19 1423  BP: 112/82  Pulse: 70  Resp: 16  Temp: (!) 97.3 F (36.3 C)  SpO2: 99%  Weight: 118 lb (53.5 kg)  Height: 4\' 9"  (1.448 m)   Body mass index is 25.53 kg/m.  Physical Exam Vitals and nursing note reviewed.  Constitutional:      General: She is not in acute distress.    Appearance: Normal appearance. She is well-developed. She is not diaphoretic.  HENT:     Head: Normocephalic and atraumatic.     Mouth/Throat:     Pharynx: No oropharyngeal exudate.  Eyes:     Pupils: Pupils are equal, round, and reactive to light.  Neck:     Thyroid: No thyromegaly.     Vascular: No JVD.     Trachea: No tracheal deviation.  Cardiovascular:     Rate and Rhythm: Normal rate and regular rhythm.     Heart sounds: Normal heart sounds. No murmur. No friction rub. No gallop.   Pulmonary:     Effort: Pulmonary effort is normal. No respiratory distress.     Breath sounds: Normal breath sounds. No wheezing or rales.  Chest:     Chest wall: No tenderness.  Abdominal:     Palpations: Abdomen is soft.  Musculoskeletal:        General: Normal range of motion.     Cervical back: Normal range of motion and neck supple.  Lymphadenopathy:     Cervical: No cervical adenopathy.  Skin:    General: Skin is warm and dry.  Neurological:     Mental Status: She is alert and oriented to person, place, and time.     Cranial Nerves: No cranial nerve deficit.  Psychiatric:        Mood and Affect: Mood normal.        Behavior: Behavior normal.        Thought Content: Thought content normal.        Judgment: Judgment normal.    Assessment/Plan: 1. Vitamin D deficiency Fill drisdol 50000iu weekly for next 90 days. Check vitamin d level with check routine, fasting labs. Continue to treat as indicated . - Vitamin D, Ergocalciferol, (DRISDOL) 1.25 MG (50000 UT) CAPS capsule; Take 1 capsule (50,000 Units total) by mouth once a week.  Dispense: 12 capsule;  Refill: 0  2. Anemia due to vitamin B12 deficiency, unspecified B12 deficiency type Check full iron panel with vitamin B12 level with lab draw. Treat as indicated.   General Counseling: Trezure verbalizes understanding of the findings of todays visit and agrees with plan of treatment. I have discussed any further diagnostic evaluation that may be needed or ordered today. We also reviewed her medications today. she has been encouraged to call the office with any questions or concerns that should arise related to todays visit.  This patient was seen by Leretha Pol FNP Collaboration with Dr Lavera Guise as a part  of collaborative care agreement  Meds ordered this encounter  Medications  . Vitamin D, Ergocalciferol, (DRISDOL) 1.25 MG (50000 UT) CAPS capsule    Sig: Take 1 capsule (50,000 Units total) by mouth once a week.    Dispense:  12 capsule    Refill:  0    Order Specific Question:   Supervising Provider    Answer:   Lyndon Code [[id number]]    Time spent: 62 Minutes      Dr Lyndon Code Internal medicine

## 2019-12-05 ENCOUNTER — Other Ambulatory Visit: Payer: Self-pay | Admitting: Nurse Practitioner

## 2019-12-06 LAB — CBC
Hematocrit: 39.7 % (ref 34.0–46.6)
Hemoglobin: 12.9 g/dL (ref 11.1–15.9)
MCH: 31 pg (ref 26.6–33.0)
MCHC: 32.5 g/dL (ref 31.5–35.7)
MCV: 95 fL (ref 79–97)
Platelets: 319 10*3/uL (ref 150–450)
RBC: 4.16 x10E6/uL (ref 3.77–5.28)
RDW: 12 % (ref 11.7–15.4)
WBC: 5 10*3/uL (ref 3.4–10.8)

## 2019-12-06 LAB — B12 AND FOLATE PANEL
Folate: 7.6 ng/mL (ref >3.0)
Vitamin B-12: 288 pg/mL (ref 232–1245)

## 2019-12-06 LAB — COMPREHENSIVE METABOLIC PANEL
ALT: 10 IU/L (ref 0–32)
AST: 16 IU/L (ref 0–40)
Albumin/Globulin Ratio: 1.7 (ref 1.2–2.2)
Albumin: 4.3 g/dL (ref 3.8–4.8)
Alkaline Phosphatase: 67 IU/L (ref 39–117)
BUN/Creatinine Ratio: 19 (ref 12–28)
BUN: 13 mg/dL (ref 8–27)
Bilirubin Total: 0.5 mg/dL (ref 0.0–1.2)
CO2: 24 mmol/L (ref 20–29)
Calcium: 9.5 mg/dL (ref 8.7–10.3)
Chloride: 105 mmol/L (ref 96–106)
Creatinine, Ser: 0.68 mg/dL (ref 0.57–1.00)
GFR calc Af Amer: 108 mL/min/{1.73_m2} (ref >59)
GFR calc non Af Amer: 93 mL/min/{1.73_m2} (ref >59)
Globulin, Total: 2.6 g/dL (ref 1.5–4.5)
Glucose: 94 mg/dL (ref 65–99)
Potassium: 4.4 mmol/L (ref 3.5–5.2)
Sodium: 142 mmol/L (ref 134–144)
Total Protein: 6.9 g/dL (ref 6.0–8.5)

## 2019-12-06 LAB — IRON AND TIBC
Iron Saturation: 46 % (ref 15–55)
Iron: 131 ug/dL (ref 27–139)
Total Iron Binding Capacity: 283 ug/dL (ref 250–450)
UIBC: 152 ug/dL (ref 118–369)

## 2019-12-06 LAB — LIPID PANEL W/O CHOL/HDL RATIO
Cholesterol, Total: 215 mg/dL — ABNORMAL HIGH (ref 100–199)
HDL: 78 mg/dL (ref >39)
LDL Chol Calc (NIH): 122 mg/dL — ABNORMAL HIGH (ref 0–99)
Triglycerides: 84 mg/dL (ref 0–149)
VLDL Cholesterol Cal: 15 mg/dL (ref 5–40)

## 2019-12-06 LAB — HGB A1C W/O EAG: Hgb A1c MFr Bld: 5.3 % (ref 4.8–5.6)

## 2019-12-06 LAB — VITAMIN D 25 HYDROXY (VIT D DEFICIENCY, FRACTURES): Vit D, 25-Hydroxy: 39.5 ng/mL (ref 30.0–100.0)

## 2019-12-06 LAB — FERRITIN: Ferritin: 68 ng/mL (ref 15–150)

## 2019-12-06 LAB — TSH: TSH: 1.33 u[IU]/mL (ref 0.450–4.500)

## 2019-12-06 LAB — T4, FREE: Free T4: 1.32 ng/dL (ref 0.82–1.77)

## 2019-12-12 NOTE — Progress Notes (Signed)
Labs ok. May benefit from b12 injections months. Review at visit 12/22/2019

## 2019-12-20 ENCOUNTER — Telehealth: Payer: Self-pay

## 2019-12-20 NOTE — Telephone Encounter (Signed)
LMOM TO SCREEN AND CONFIRM FOR 12-22-19 OV.

## 2019-12-22 ENCOUNTER — Other Ambulatory Visit: Payer: Self-pay | Admitting: Nurse Practitioner

## 2020-07-10 ENCOUNTER — Telehealth: Payer: Self-pay

## 2020-07-10 NOTE — Telephone Encounter (Signed)
Confirmed and screened for 07-12-20 ov. 

## 2020-07-12 ENCOUNTER — Encounter: Payer: Self-pay | Admitting: Nurse Practitioner

## 2020-07-12 ENCOUNTER — Ambulatory Visit (INDEPENDENT_AMBULATORY_CARE_PROVIDER_SITE_OTHER): Payer: 59 | Admitting: Nurse Practitioner

## 2020-07-12 VITALS — BP 109/73 | HR 67 | Temp 97.5°F | Resp 16 | Ht <= 58 in | Wt 117.2 lb

## 2020-07-12 DIAGNOSIS — D508 Other iron deficiency anemias: Secondary | ICD-10-CM | POA: Diagnosis not present

## 2020-07-12 DIAGNOSIS — Z0001 Encounter for general adult medical examination with abnormal findings: Secondary | ICD-10-CM

## 2020-07-12 DIAGNOSIS — E559 Vitamin D deficiency, unspecified: Secondary | ICD-10-CM | POA: Diagnosis not present

## 2020-07-12 DIAGNOSIS — R300 Dysuria: Secondary | ICD-10-CM | POA: Diagnosis not present

## 2020-07-12 NOTE — Addendum Note (Signed)
Addended by: Golda Acre on: 07/12/2020 04:22 PM   Modules accepted: Orders

## 2020-07-12 NOTE — Progress Notes (Signed)
[facility] [address] [place], [place] [address]  Internal MEDICINE  Office Visit Note  Patient Name: Erica Phelps  [id number]  [id number]  Date of Service: 07/12/2020  Chief Complaint  Patient presents with  . Annual Exam  . Quality Metric Gaps    TDAP, Colonoscopy     The patient presents for health maintenance exam. She states that she is feeling well. She has had routine, fasting labs done 02/2020 and will get a copy to our office for her chart. She did have both COVID 19 vaccines and these are now documented in her immunization record. Her last pap smear was done 12/2018 with normal results. Her last mammogram was 12/20/2018 and was negative. She has brought documentation form from her health insurance company with suggestions for routine care. An order for cologuard will be sent to [facility] for colon cancer screening. I have completed the form and returned it to the patient.   Pt is here for routine health maintenance examination  Current Medication: Outpatient Encounter Medications as of 07/12/2020  Medication Sig  . [DISCONTINUED] Vitamin D, Ergocalciferol, (DRISDOL) 1.25 MG (50000 UT) CAPS capsule Take 1 capsule (50,000 Units total) by mouth once a week.   No facility-administered encounter medications on file as of 07/12/2020.    Surgical History: Past Surgical History:  Procedure Laterality Date  . KNEE ARTHROSCOPY      Medical History: Past Medical History:  Diagnosis Date  . Osteopenia     Family History: Family History  Problem Relation Age of Onset  . Heart disease Father       Review of Systems  Constitutional: Negative for activity change, chills, fatigue and unexpected weight change.  HENT: Negative for congestion, postnasal drip, rhinorrhea, sneezing and sore throat.   Respiratory: Negative for cough, chest tightness, shortness of breath and wheezing.   Cardiovascular: Negative for chest pain and palpitations.   Gastrointestinal: Negative for abdominal pain, constipation, diarrhea, nausea and vomiting.  Endocrine: Negative for cold intolerance, heat intolerance, polydipsia and polyuria.  Genitourinary: Negative for dysuria, frequency, hematuria and urgency.  Musculoskeletal: Negative for arthralgias, back pain, joint swelling and neck pain.  Skin: Negative for rash.  Allergic/Immunologic: Negative for environmental allergies.  Neurological: Negative for dizziness, tremors, numbness and headaches.  Hematological: Negative for adenopathy. Does not bruise/bleed easily.  Psychiatric/Behavioral: Negative for behavioral problems (Depression), sleep disturbance and suicidal ideas. The patient is not nervous/anxious.      Today's Vitals   07/12/20 0834  BP: 109/73  Pulse: 67  Resp: 16  Temp: (!) 97.5 F (36.4 C)  SpO2: 100%  Weight: 117 lb 3.2 oz (53.2 kg)  Height: 4\' 9"  (1.448 m)   Body mass index is 25.36 kg/m.  Physical Exam Vitals and nursing note reviewed.  Constitutional:      General: She is not in acute distress.    Appearance: Normal appearance. She is well-developed. She is not diaphoretic.  HENT:     Head: Normocephalic and atraumatic.     Nose: Nose normal.     Mouth/Throat:     Pharynx: No oropharyngeal exudate.  Eyes:     Pupils: Pupils are equal, round, and reactive to light.  Neck:     Thyroid: No thyromegaly.     Vascular: No JVD.     Trachea: No tracheal deviation.  Cardiovascular:     Rate and Rhythm: Normal rate and regular rhythm.     Heart sounds: Normal heart sounds. No murmur heard.  No friction rub. No gallop.   Pulmonary:     Effort: Pulmonary effort is normal. No respiratory distress.     Breath sounds: Normal breath sounds. No wheezing or rales.  Chest:     Chest wall: No tenderness.     Breasts:        Right: Normal. No swelling, bleeding, inverted nipple, mass, nipple discharge, skin change or tenderness.        Left: Normal. No swelling, bleeding,  inverted nipple, mass, nipple discharge, skin change or tenderness.  Abdominal:     General: Bowel sounds are normal.     Palpations: Abdomen is soft.     Tenderness: There is no abdominal tenderness.  Musculoskeletal:        General: Normal range of motion.     Cervical back: Normal range of motion and neck supple.  Lymphadenopathy:     Cervical: No cervical adenopathy.     Upper Body:     Right upper body: No axillary adenopathy.     Left upper body: No axillary adenopathy.  Skin:    General: Skin is warm and dry.  Neurological:     Mental Status: She is alert and oriented to person, place, and time.     Cranial Nerves: No cranial nerve deficit.  Psychiatric:        Mood and Affect: Mood normal.        Behavior: Behavior normal.        Thought Content: Thought content normal.        Judgment: Judgment normal.     Assessment/Plan: 1. Encounter for general adult medical examination with abnormal findings Annual health maintenance exam today. Patient to bring copy of recent labs for her chart and review.   2. Vitamin D deficiency Review vitamin d level and treat as indicated   3. Iron deficiency anemia secondary to inadequate dietary iron intake Review recent labs nad treat iron deficiency as indicated.   4. Dysuria - UA/M w/rflx Culture, Routine  General Counseling: Erica Phelps verbalizes understanding of the findings of todays visit and agrees with plan of treatment. I have discussed any further diagnostic evaluation that may be needed or ordered today. We also reviewed her medications today. she has been encouraged to call the office with any questions or concerns that should arise related to todays visit.    Counseling:  This patient was seen by Vincent Gros FNP Collaboration with Dr Lyndon Code as a part of collaborative care agreement  Orders Placed This Encounter  Procedures  . UA/M w/rflx Culture, Routine      Total time spent: 45 Minutes  Time spent  includes review of chart, medications, test results, and follow up plan with the patient.     Lyndon Code, MD  Internal Medicine

## 2020-07-27 ENCOUNTER — Telehealth: Payer: Self-pay

## 2020-07-27 NOTE — Telephone Encounter (Signed)
Faxed Cologuard paperwork for patient.  

## 2020-08-09 LAB — EXTERNAL GENERIC LAB PROCEDURE: COLOGUARD: NEGATIVE

## 2021-06-12 LAB — UA/M W/RFLX CULTURE, ROUTINE

## 2021-07-15 ENCOUNTER — Encounter: Payer: 59 | Admitting: Nurse Practitioner

## 2022-08-13 ENCOUNTER — Other Ambulatory Visit: Payer: Self-pay | Admitting: Internal Medicine

## 2022-08-13 DIAGNOSIS — Z1231 Encounter for screening mammogram for malignant neoplasm of breast: Secondary | ICD-10-CM

## 2022-08-21 ENCOUNTER — Ambulatory Visit
Admission: RE | Admit: 2022-08-21 | Discharge: 2022-08-21 | Disposition: A | Discharge status: Home / Self Care | Payer: Medicare Other | Source: Ambulatory Visit | Attending: Internal Medicine | Admitting: Internal Medicine

## 2022-08-21 DIAGNOSIS — Z1231 Encounter for screening mammogram for malignant neoplasm of breast: Secondary | ICD-10-CM | POA: Insufficient documentation

## 2022-08-25 ENCOUNTER — Inpatient Hospital Stay
Admission: RE | Admit: 2022-08-25 | Discharge: 2022-08-25 | Disposition: A | Discharge status: Home / Self Care | Payer: Self-pay | Source: Ambulatory Visit | Attending: *Deleted | Admitting: *Deleted

## 2022-08-25 ENCOUNTER — Other Ambulatory Visit: Payer: Self-pay | Admitting: *Deleted

## 2022-08-25 DIAGNOSIS — Z1231 Encounter for screening mammogram for malignant neoplasm of breast: Secondary | ICD-10-CM
